# Patient Record
Sex: Female | Born: 2005 | Race: Black or African American | Hispanic: No | Marital: Single | Smoking: Never smoker
Health system: Southern US, Community
[De-identification: ages and names within clinical notes are randomized; demographics above are authoritative.]

## PROBLEM LIST (undated history)

## (undated) DIAGNOSIS — T7840XA Allergy, unspecified, initial encounter: Secondary | ICD-10-CM

## (undated) DIAGNOSIS — E109 Type 1 diabetes mellitus without complications: Secondary | ICD-10-CM

## (undated) DIAGNOSIS — L309 Dermatitis, unspecified: Secondary | ICD-10-CM

## (undated) HISTORY — DX: Allergy, unspecified, initial encounter: T78.40XA

## (undated) HISTORY — DX: Type 1 diabetes mellitus without complications: E10.9

---
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
## 2023-01-01 ENCOUNTER — Encounter (INDEPENDENT_AMBULATORY_CARE_PROVIDER_SITE_OTHER): Payer: Self-pay | Admitting: Pediatrics

## 2023-01-03 ENCOUNTER — Encounter (INDEPENDENT_AMBULATORY_CARE_PROVIDER_SITE_OTHER): Payer: Self-pay

## 2023-01-17 ENCOUNTER — Telehealth: Payer: Self-pay | Admitting: Pediatrics

## 2023-01-17 DIAGNOSIS — E1065 Type 1 diabetes mellitus with hyperglycemia: Secondary | ICD-10-CM

## 2023-01-17 MED ORDER — INSULIN LISPRO 100 UNIT/ML IJ SOLN
INTRAMUSCULAR | 1 refills | Status: DC
Start: 2023-01-17 — End: 2023-02-14

## 2023-01-17 NOTE — Telephone Encounter (Signed)
Received message from front office to call patient back.  Patient was thankful and insulin is ready for pick up.  We did discuss that EPIC flagged her insulin is cheaper by using mail order.  She would like to keep it at the local pharmacy for now.  She mentioned that she is switching to a Mobi and we should receive paperwork from [employer] soon.  I told her I will reach out to my rep Thayer Ohm to follow up.  She verbalized understanding.

## 2023-01-17 NOTE — Telephone Encounter (Signed)
  Name of who is calling: Danne Baxter   Caller's Relationship to Patient: Self  Best contact number: [phone]  Provider they see: Larinda Buttery  Reason for call: Patient needs insulin script transferred from [facility] to  [facility] [address] [place]     PRESCRIPTION REFILL ONLY  Name of prescription: Humalog  Pharmacy: From [facility] To [facility] [address] [place]

## 2023-01-23 ENCOUNTER — Encounter (INDEPENDENT_AMBULATORY_CARE_PROVIDER_SITE_OTHER): Payer: Self-pay | Admitting: Pediatrics

## 2023-01-23 DIAGNOSIS — E1065 Type 1 diabetes mellitus with hyperglycemia: Secondary | ICD-10-CM

## 2023-02-04 ENCOUNTER — Encounter (INDEPENDENT_AMBULATORY_CARE_PROVIDER_SITE_OTHER): Payer: Self-pay

## 2023-02-04 DIAGNOSIS — E109 Type 1 diabetes mellitus without complications: Secondary | ICD-10-CM

## 2023-02-04 MED ORDER — DEXCOM G6 TRANSMITTER MISC
1 refills | Status: DC
Start: 2023-02-04 — End: 2023-07-02

## 2023-02-07 ENCOUNTER — Telehealth (INDEPENDENT_AMBULATORY_CARE_PROVIDER_SITE_OTHER): Payer: Self-pay | Admitting: Pediatrics

## 2023-02-07 NOTE — Telephone Encounter (Signed)
  Name of who is calling: Nataly  Caller's Relationship to Patient: Self  Best contact number: [phone]    Provider they see: Larinda Buttery  Reason for call: Patient is attempting to get training on the tandum mobi pump and inquiring if she will be able to get training by or on her next appt with Jessup on 9/19.

## 2023-02-11 ENCOUNTER — Telehealth (INDEPENDENT_AMBULATORY_CARE_PROVIDER_SITE_OTHER): Payer: Self-pay | Admitting: Pediatrics

## 2023-02-11 DIAGNOSIS — E109 Type 1 diabetes mellitus without complications: Secondary | ICD-10-CM

## 2023-02-11 NOTE — Telephone Encounter (Signed)
  Name of who is calling: Lailanie  Caller's Relationship to Patient: Self  Best contact number: [phone]  Provider they see:  Dr.Jessup  Reason for call: Rasheka received a message from [place] regarding her new insilin pump. She's needing a PA.      PRESCRIPTION REFILL ONLY  Name of prescription: pandem mobi  Pharmacy: [facility]

## 2023-02-14 ENCOUNTER — Encounter (INDEPENDENT_AMBULATORY_CARE_PROVIDER_SITE_OTHER): Payer: Self-pay | Admitting: Pediatrics

## 2023-02-14 ENCOUNTER — Other Ambulatory Visit (INDEPENDENT_AMBULATORY_CARE_PROVIDER_SITE_OTHER): Payer: Self-pay | Admitting: Family

## 2023-02-14 DIAGNOSIS — E1065 Type 1 diabetes mellitus with hyperglycemia: Secondary | ICD-10-CM

## 2023-02-15 DIAGNOSIS — Z794 Long term (current) use of insulin: Secondary | ICD-10-CM | POA: Diagnosis not present

## 2023-02-15 DIAGNOSIS — E1065 Type 1 diabetes mellitus with hyperglycemia: Secondary | ICD-10-CM | POA: Diagnosis not present

## 2023-02-19 MED ORDER — OMNIPOD 5 G6 PODS (GEN 5) MISC
5 refills | Status: AC
Start: 2023-02-19 — End: ?

## 2023-02-19 NOTE — Addendum Note (Signed)
Addended by: Angelene Giovanni A on: 02/19/2023 08:39 AM   Modules accepted: Orders

## 2023-02-27 ENCOUNTER — Encounter (INDEPENDENT_AMBULATORY_CARE_PROVIDER_SITE_OTHER): Payer: Self-pay | Admitting: Pediatrics

## 2023-02-28 ENCOUNTER — Encounter: Payer: Self-pay | Admitting: *Deleted

## 2023-03-07 ENCOUNTER — Ambulatory Visit (INDEPENDENT_AMBULATORY_CARE_PROVIDER_SITE_OTHER): Payer: BC Managed Care – PPO | Admitting: Pediatrics

## 2023-03-07 ENCOUNTER — Encounter (INDEPENDENT_AMBULATORY_CARE_PROVIDER_SITE_OTHER): Payer: Self-pay | Admitting: Pediatrics

## 2023-03-07 VITALS — BP 116/80 | HR 80 | Ht 63.31 in | Wt 218.4 lb

## 2023-03-07 DIAGNOSIS — L83 Acanthosis nigricans: Secondary | ICD-10-CM | POA: Diagnosis not present

## 2023-03-07 DIAGNOSIS — E88819 Insulin resistance, unspecified: Secondary | ICD-10-CM

## 2023-03-07 DIAGNOSIS — Z9641 Presence of insulin pump (external) (internal): Secondary | ICD-10-CM | POA: Diagnosis not present

## 2023-03-07 DIAGNOSIS — E1065 Type 1 diabetes mellitus with hyperglycemia: Secondary | ICD-10-CM

## 2023-03-07 DIAGNOSIS — Z4681 Encounter for fitting and adjustment of insulin pump: Secondary | ICD-10-CM

## 2023-03-07 LAB — POCT GLUCOSE (DEVICE FOR HOME USE): Glucose Fasting, POC: 138 mg/dL — AB (ref 70–99)

## 2023-03-07 NOTE — Patient Instructions (Signed)
It was a pleasure to see you in clinic today.   Feel free to contact our office during normal business hours at [phone] with questions or concerns. If you have an emergency after normal business hours, please call the above number to reach our answering service who will contact the on-call pediatric endocrinologist.  If you choose to communicate with [place] via MyChart, please do not send urgent messages as this inbox is NOT monitored on nights or weekends.  Urgent concerns should be discussed with the on-call pediatric endocrinologist.  -Always have fast sugar with you in case of low blood sugar (glucose tabs, regular juice or soda, candy) -Always wear your ID that states you have diabetes -Always bring your meter/continuous glucose monitor to your visit  Hypoglycemia  Shaking or trembling. Sweating and chills. Dizziness or lightheadedness. Faster heart rate. Headaches. Hunger. Nausea. Nervousness or irritability. Pale skin. Restless sleep. Weakness. Blurry vision. Confusion or trouble concentrating. Sleepiness. Slurred speech. Tingling or numbness in the face or mouth.  How do I treat an episode of hypoglycemia? The American Diabetes Association recommends the "15-15 rule" for an episode of hypoglycemia: Eat or drink 15 grams of fast-acting carbs (4oz regular soda or juice, 1 pkg fruit snacks, 4 glucose tabs) to raise your blood sugar. After 15 minutes, check your blood sugar. If it's still below 80 mg/dL, have another 15 grams of fast-acting carbs. Repeat until your blood sugar is at least 80 mg/dL.  Hyperglycemia  Frequent urination Increased thirst Blurred vision Fatigue Headache          Diabetic Ketoacidosis (DKA)  If hyperglycemia goes untreated, it can cause toxic acids (ketones) to build up in your blood and urine (ketoacidosis). Signs and symptoms include: Fruity-smelling breath Nausea and vomiting Shortness of breath Dry  mouth Weakness Confusion Coma Abdominal pain  Sick day/Ketones Protocol  Check blood glucose every 3 hours  Give rapid acting insulin correction dose           every 3 hours until ketones are negative Check urine ketones every 2 hours (until ketones are negative)  Drink plenty of fluids (water, Pedialyte) every hour Notify clinic of sickness/ketones  If you develop signs of DKA (especially vomiting or abdominal pain and inability to drink), go to [facility] Pediatric Emergency room immediately.   Hemoglobin A1c levels

## 2023-03-07 NOTE — Progress Notes (Signed)
Pediatric Endocrinology Diabetes Consultation Follow-up Visit  Kimberly [name] 2005-10-12 [id number]  Chief Complaint: Follow-up type 1 diabetes with insulin resistance  Lucio Edward, MD   HPI: Kimberly [name] is a 17 y.o. female presenting for follow-up of the above concerns.  she attended this visit alone.  1. Lajoyce was hospitalized at [facility] on 03/30/16 after presenting to PCP's office with a 1.5 month hx of 35lb weight loss, fatigue, and 1 day of abdominal pain (no significant polyuria or nocturia).  CBG at PCP's office was 593 with 3+ urine ketones, so she was sent the [facility] ED where pH was 7.08, bicarb was 7, glucose was 620, anion gap was 18, with urine glucose >1000 and ketones >80.  She was admitted to PICU for insulin initiation.  Diabetes screening labs showed positive GAD Ab, positive islet cell Ab, positive insulin Ab, low c-peptide at 0.9, negative celiac screen, A1c >15.5, and normal TFTs (TSH 3.795, FT4 0.79). She was started on metformin for insulin resistance in 03/2017 and started an omnipod insulin pump in 05/2017.  She transitioned to omnipod 5 pump in 01/2021.  2. Since last visit on 06/27/22, she has been OK.   ED visits/Hospitalizations: No  Concerns:  -Working to get her license though A1c has been too high on omnipod 5.  Has changed to Mobi (was trained by [employer]; settings were transferred from omnipod.  Likes Mobi but going through cartridges quickly.   Omnipod 5 settings  Basal (Max: 3.5 units/hr) 12AM 1.4  8AM 1.9  12PM 2       Total: 42.8 units   Insulin to carbohydrate ratio (ICR) 12AM 8  6AM 5  12PM 5  5PM 5            Max Bolus: 30 units   Insulin Sensitivity Factor (ISF) 12AM 35  6AM 20  9PM 35                   Target BG 12AM 110                           Active Insulin Time: 3 hours     BG/Pump/CGM download:   Mobi started 02/27/23:  T-slim pump settings: Time Basal Rate Correction Factor Carb Ratio  Target BG  12AM 1.4 35 8 110  6AM 1.4 35 5 110  8AM 1.9 35 5 110  9AM 1.9 35 5 110  12PM 2 35 5 110  6PM 2 20 5  110        Total Daily Basal: 42.8 units/day     Interpretation: Overall blood sugars are much improved on Mobi  Hypoglycemia: Can feel lows. No glucagon needed Wearing Med-alert ID currently: needs to order one Injection sites: leg and abd Annual labs due: 08/2022- ordered today will draw next month Ophthalmology: 04/2020, no retinopathy reported. Reminded to schedule annual appt  Metformin XR 1500mg  daily -tries to take it but very hard to swallow.  Wants to know if there are alternatives besides Ozempic and metformin Cannot tolerate ozempic   ROS: All systems reviewed with pertinent positives listed below; otherwise negative. Constitutional: Weight has Increased 37 lb since last visit.         Past Medical History:   Past Medical History:  Diagnosis Date   Allergy    Eczema    Type 1 diabetes mellitus (HCC) 2017   +GAD ab, +Islet cell ab, +Insulin Ab, C-peptide 0.9  Medications:  Outpatient Encounter Medications as of 03/07/2023  Medication Sig   acetone, urine, test strip Check ketones per protocol   Alcohol Swabs (ALCOHOL PADS) 70 % PADS Use to wipe skin before injections   Blood Glucose Monitoring Suppl (CONTOUR NEXT EZ) w/Device KIT 1 kit by Does not apply route daily.   Continuous Blood Gluc Sensor (DEXCOM G6 SENSOR) MISC USE AS DIRECTED TO CHECK GLUCOSE. WEAR CONTINUSOULY FOR 10 DAYS THEN REPLACE WITH A NEW SENSOR.   Continuous Glucose Transmitter (DEXCOM G6 TRANSMITTER) MISC USE WITH G6 SENSORS - CHANGE TRANSMITTER EVERY 90 DAYS   Crisaborole (EUCRISA) 2 % OINT Apply 1 application topically 2 (two) times daily as needed.   Glucagon (BAQSIMI TWO PACK) 3 MG/DOSE POWD Place 1 application  into the nose as needed. Use as directed if unconscious, unable to take food po, or having a seizure due to hypoglycemia   glucose blood (CONTOUR NEXT TEST) test strip  Check glucose 6x daily   insulin lispro (HUMALOG KWIKPEN) 100 UNIT/ML KwikPen Up to 50 units/day   insulin lispro (HUMALOG) 100 UNIT/ML injection INJECT UP TO 200 UNITS EVERY 48 HOURS   Insulin Pen Needle (INSUPEN PEN NEEDLES) 32G X 4 MM MISC USE WITH INSULIN PEN 6 TIMES DAILY   metFORMIN (GLUCOPHAGE) 500 MG tablet Take 2 tablets (1,000 mg total) by mouth 2 (two) times daily with a meal.   metFORMIN (GLUCOPHAGE-XR) 750 MG 24 hr tablet Take 2 tablets (1,500 mg total) by mouth daily with supper.   norethindrone-ethinyl estradiol-FE (LOESTRIN FE) 1-20 MG-MCG tablet Take 1 tablet by mouth daily.   ACCU-CHEK FASTCLIX LANCETS MISC Check sugar 10 x daily (Patient not taking: Reported on 03/07/2023)   azelastine (ASTELIN) 0.1 % nasal spray 2 sprays each nostril 1-2 times daily as needed. (Patient not taking: Reported on 03/07/2023)   BD PEN NEEDLE NANO U/F 32G X 4 MM MISC USE 1 PEN NEEDLE AS DIRECTED (Patient not taking: Reported on 03/07/2023)   cetirizine (ZYRTEC) 10 MG tablet Take 1 tablet (10 mg total) by mouth daily. (Patient not taking: Reported on 03/07/2023)   fluticasone (FLONASE) 50 MCG/ACT nasal spray Use per MD order for site irritation. (Patient not taking: Reported on 03/07/2023)   Insulin Disposable Pump (OMNIPOD 5 G6 PODS, GEN 5,) MISC INJECT 1 POD SUBCUTANEOUSLY AS  DIRECTED AND CHANGE POD EVERY 48 HOURS (Patient not taking: Reported on 03/07/2023)   Insulin Glargine (LANTUS SOLOSTAR) 100 UNIT/ML Solostar Pen Up to 50 units per day as directed by MD (Patient not taking: Reported on 03/07/2023)   montelukast (SINGULAIR) 10 MG tablet Take 1 tablet (10 mg total) by mouth at bedtime. (Patient not taking: Reported on 03/07/2023)   mupirocin ointment (BACTROBAN) 2 % Apply to skin on ears three times a day for 5 days (Patient not taking: Reported on 03/07/2023)   Semaglutide,0.25 or 0.5MG /DOS, (OZEMPIC, 0.25 OR 0.5 MG/DOSE,) 2 MG/1.5ML SOPN Inject 0.5 mg into the skin once a week. Use this dose until your  next appointment with Dr. Larinda Buttery. (Patient not taking: Reported on 03/07/2023)   triamcinolone ointment (KENALOG) 0.1 % Apply 1 application topically 2 (two) times daily. (Patient not taking: Reported on 03/07/2023)   No facility-administered encounter medications on file as of 03/07/2023.   Allergies: No Known Allergies   Surgical History: History reviewed. No pertinent surgical history.  Family History:  Family History  Problem Relation Age of Onset   Insulin resistance Mother        Treated with metformin  Hypothyroidism Mother        Treated with synthroid   Allergic rhinitis Mother    Diabetes type II Maternal Grandmother    Diabetes type II Paternal Grandfather    Diabetes Maternal Uncle 15       treated with insulin   Diabetes type II Maternal Aunt    Hypothyroidism Maternal Aunt        treated with synthroid   Diabetes type II Sister    Allergic rhinitis Sister     Social History: Social History   Social History Narrative   Taking a break from college  Living in [place], they will spend majority of October at a camp in [place]  Physical Exam:  Vitals:   03/07/23 0811  BP: 116/80  Pulse: 80  Weight: 218 lb 6.4 oz (99.1 kg)  Height: 5' 3.31" (1.608 m)    BP 116/80 (BP Location: Left Arm, Patient Position: Sitting, Cuff Size: Normal)   Pulse 80   Ht 5' 3.31" (1.608 m)   Wt 218 lb 6.4 oz (99.1 kg)   BMI 38.31 kg/m  Body mass index: body mass index is 38.31 kg/m. Blood pressure %iles are not available for patients who are 18 years or older.  Wt Readings from Last 3 Encounters:  03/07/23 218 lb 6.4 oz (99.1 kg) (99%, Z= 2.19)*  06/27/22 181 lb 6.4 oz (82.3 kg) (95%, Z= 1.68)*  08/23/21 (!) 225 lb 2 oz (102.1 kg) (99%, Z= 2.26)*   * Growth percentiles are based on CDC (Girls, 2-20 Years) data.   Ht Readings from Last 3 Encounters:  03/07/23 5' 3.31" (1.608 m) (35%, Z= -0.38)*  08/23/21 5' 3.39" (1.61 m) (37%, Z= -0.32)*  05/17/21 5' 2.84" (1.596 m)  (30%, Z= -0.53)*   * Growth percentiles are based on CDC (Girls, 2-20 Years) data.   Body mass index is 38.31 kg/m. 99 %ile (Z= 2.19) based on CDC (Girls, 2-20 Years) weight-for-age data using data from 03/07/2023. 35 %ile (Z= -0.38) based on CDC (Girls, 2-20 Years) Stature-for-age data based on Stature recorded on 03/07/2023.  General: Well developed, well nourished female in no acute distress.  Appears stated age Head: Normocephalic, atraumatic.   Eyes:  Pupils equal and round. EOMI.   Sclera white.  No eye drainage.   Ears/Nose/Mouth/Throat: Nares patent, no nasal drainage.  Moist mucous membranes, normal dentition Neck: supple, no cervical lymphadenopathy, no thyromegaly Cardiovascular: regular rate, normal S1/S2, no murmurs Respiratory: No increased work of breathing.  Lungs clear to auscultation bilaterally.  No wheezes. Abdomen: soft, nontender, nondistended.  Extremities: warm, well perfused, cap refill < 2 sec.   Musculoskeletal: Normal muscle mass.  Normal strength Skin: warm, dry.  No rash or lesions.  Skin normal at pump sites Neurologic: alert and oriented, normal speech, no tremor   Labs: Results for orders placed or performed in visit on 03/07/23  POCT Glucose (Device for Home Use)  Result Value Ref Range   Glucose Fasting, POC 138 (A) 70 - 99 mg/dL   POC Glucose    POCT glycosylated hemoglobin (Hb A1C)  Result Value Ref Range   Hemoglobin A1C     HbA1c POC (<> result, manual entry)     HbA1c, POC (prediabetic range)     HbA1c, POC (controlled diabetic range) 11.8 (A) 0.0 - 7.0 %    A1c trend: >15.5 at diagnosis in 03/2016-->12.1% 08/2016-->13.6% 11/2016-->12.1% in 03/2017-->10.7% in 08/2017--> 10.5% 12/2017-->9.4% 03/2018 -->11% 07/2018--> 10.8% 01/2019--> 12% 05/2019--> 12.7% 10/2019--> 12.4% 01/2020-->12.2% 07/2020-->  12% 01/2021--> 8.7% 04/2021--> 10.7% 08/2021--> 14% 06/2022--> 11.8% 02/2023  Assessment/Plan:  Sarahelizabeth Cange is a 17 y.o. female with T1DM on a pump  ([employer] Mobi) and CGM regimen.   A1c is lower than last visit  The ADA goal for A1c is <7.0%.   Dexcom tracing shows she is not meeting goal of TIR >70%.  she is doing much better on the Mobi, though would benefit from a more concentrated insulin so she does not have to change the cartridge every other day.  A1c is improving, though not yet below the threshold for me to sign DMV paperwork.  She also has significant insulin resistance and is treated with metformin XR; she is unable to swallow metformin pills and did not tolerate Ozempic.  When a patient is on insulin, intensive monitoring of blood glucose levels and continuous insulin titration is vital to avoid insulin toxicity leading to severe hypoglycemia. Severe hypoglycemia can lead to seizure or death. Hyperglycemia can also result from inadequate insulin dosing and can lead to ketosis requiring ICU admission and intravenous insulin.   1. Type 1 diabetes with hyperglycemia -POC glucose and A1c as above, -CGM download reviewed extensively (see interpretation above), --Encouraged to wear med alert ID every day, -Encouraged to rotate injection sites, and -Provided with my contact information and advised to email/send mychart with questions/need for BG review -Given desire to obtain driver's license and recent improvement in A1c with mobi pump, will repeat hemoglobin A1c with annual screening labs in 1 month.  These have been ordered.  I will be in contact with her when I see these results. -Explained transition to U-200.  I will send a prescription for this to the pharmacy.  I have asked her to let me know when she is able to pick this up so I can send different pump settings.  Reviewed that if she does go back to OmniPod 5 using U-200, she will need to reset the PDM.  2. Insulin Pump in place -Made the following pump changes: No pump changes today.  Will switch to U200 as above Sleep mode turned off.   Pump Settings: T-slim pump  settings: Time Basal Rate Correction Factor Carb Ratio Target BG  12AM 1.4 35 8 110  6AM 1.4 35 5 110  8AM 1.9 35 5 110  9AM 1.9 35 5 110  12PM 2 35 5 110  6PM 2 20 5  110        Total Daily Basal: 42.8 units/day   If your pump breaks: Back-up lantus dose 42 units every 24 hours Novolog 1 unit for every 5 carbs (carb ratio)  Novolog correction 1 unit for every 20 above 150mg /dl (200mg /dl at bedtime)  Please call [phone] with questions      Pump settings using U-200: T-slim pump settings: Using U-200 Time Basal Rate Correction Factor Carb Ratio Target BG  12AM 0.7 70 16 110  6AM 0.7 70 10 110  8AM 0.9 70 10 110  9AM 0.9 70 10 110  12PM 1 70 10 110  6PM 1 40 10 110         3/4. Insulin resistance/acanthosis nigricans -Will stop metformin for now.  Since blood sugar also much improved on Mobi, we will not treat insulin resistance at this point.  Will consider other options for insulin resistance if needed at future visits.   Follow-up:   Return in about 3 months (around 06/06/2023).   >40 minutes spent today reviewing the medical chart, counseling  the patient/family, and documenting today's encounter.   Casimiro Needle, MD

## 2023-03-08 LAB — POCT GLYCOSYLATED HEMOGLOBIN (HGB A1C): HbA1c, POC (controlled diabetic range): 11.8 % — AB (ref 0.0–7.0)

## 2023-03-08 MED ORDER — HUMALOG KWIKPEN 200 UNIT/ML ~~LOC~~ SOPN
PEN_INJECTOR | SUBCUTANEOUS | 8 refills | Status: DC
Start: 2023-03-08 — End: 2023-05-15

## 2023-03-15 ENCOUNTER — Encounter (INDEPENDENT_AMBULATORY_CARE_PROVIDER_SITE_OTHER): Payer: Self-pay | Admitting: Pediatrics

## 2023-03-15 ENCOUNTER — Encounter: Payer: Self-pay | Admitting: Pediatrics

## 2023-04-11 ENCOUNTER — Encounter (INDEPENDENT_AMBULATORY_CARE_PROVIDER_SITE_OTHER): Payer: Self-pay | Admitting: Pediatrics

## 2023-04-11 ENCOUNTER — Encounter (INDEPENDENT_AMBULATORY_CARE_PROVIDER_SITE_OTHER): Payer: Self-pay | Admitting: Family

## 2023-05-03 ENCOUNTER — Other Ambulatory Visit (INDEPENDENT_AMBULATORY_CARE_PROVIDER_SITE_OTHER): Payer: Self-pay | Admitting: Pediatrics

## 2023-05-03 DIAGNOSIS — E1065 Type 1 diabetes mellitus with hyperglycemia: Secondary | ICD-10-CM

## 2023-05-07 ENCOUNTER — Encounter (INDEPENDENT_AMBULATORY_CARE_PROVIDER_SITE_OTHER): Payer: Self-pay | Admitting: Pediatrics

## 2023-05-07 ENCOUNTER — Telehealth (INDEPENDENT_AMBULATORY_CARE_PROVIDER_SITE_OTHER): Payer: Self-pay | Admitting: Pediatrics

## 2023-05-07 DIAGNOSIS — Z3202 Encounter for pregnancy test, result negative: Secondary | ICD-10-CM | POA: Diagnosis not present

## 2023-05-07 DIAGNOSIS — Z3041 Encounter for surveillance of contraceptive pills: Secondary | ICD-10-CM | POA: Diagnosis not present

## 2023-05-07 NOTE — Telephone Encounter (Signed)
  Name of who is calling: Kimberly [name]   Caller's Relationship to Patient:  Best contact number: [phone]  Provider they see: Larinda Buttery   Reason for call: Called regarding previous note, says she is headed to pharmacy but she needs a call back regarding her pump settings since its a higher concentrated solution.      PRESCRIPTION REFILL ONLY  Name of prescription:  Pharmacy:

## 2023-05-07 NOTE — Telephone Encounter (Signed)
Angelene Giovanni, RN sent pt the U-200 pump settings via [place] today.  Casimiro Needle, MD

## 2023-05-07 NOTE — Telephone Encounter (Signed)
Who's calling (name and relationship to patient) : Kimberly [name]; mom   Best contact number: [phone]  Provider they see: Dr. Larinda Buttery   Reason for call: [name] is calling stating that she sent a message through Mychart regarding the Humalog 200. She stated that she contacted the pharmacy, and she  would be heading up there in about an hour. She stated that she would be needing the settings due to it being a higher concentrated solution.    Call ID:      PRESCRIPTION REFILL ONLY  Name of prescription:  Pharmacy:

## 2023-05-07 NOTE — Telephone Encounter (Signed)
Returned call to patient, settings for U200 are in last progress note.  Screen shot and sent to patient by mychart.  Discussed she can see her last office visits in [place] in progress notes and her last one was 03/07/2023.  Told her to mychart me back if she has questions.  If she has questions after hours that Dr. Larinda Buttery is the one on call.

## 2023-05-10 ENCOUNTER — Encounter (INDEPENDENT_AMBULATORY_CARE_PROVIDER_SITE_OTHER): Payer: Self-pay | Admitting: Pediatrics

## 2023-05-10 DIAGNOSIS — E1065 Type 1 diabetes mellitus with hyperglycemia: Secondary | ICD-10-CM

## 2023-05-10 MED ORDER — DEXCOM G6 SENSOR MISC: 1 refills | Status: DC

## 2023-05-15 ENCOUNTER — Encounter (INDEPENDENT_AMBULATORY_CARE_PROVIDER_SITE_OTHER): Payer: Self-pay | Admitting: Pediatrics

## 2023-05-15 ENCOUNTER — Telehealth (INDEPENDENT_AMBULATORY_CARE_PROVIDER_SITE_OTHER): Payer: Self-pay | Admitting: Pediatrics

## 2023-05-15 DIAGNOSIS — E1065 Type 1 diabetes mellitus with hyperglycemia: Secondary | ICD-10-CM

## 2023-05-15 DIAGNOSIS — E88819 Insulin resistance, unspecified: Secondary | ICD-10-CM

## 2023-05-15 MED ORDER — HUMALOG KWIKPEN 200 UNIT/ML ~~LOC~~ SOPN: PEN_INJECTOR | SUBCUTANEOUS | 5 refills | Status: DC

## 2023-05-15 NOTE — Telephone Encounter (Signed)
A user error has taken place: encounter opened in error, closed for administrative reasons.

## 2023-05-15 NOTE — Telephone Encounter (Signed)
See my chart message

## 2023-05-15 NOTE — Telephone Encounter (Signed)
Spoke with on call provider, Dr. Quincy Sheehan to review the dosage of U200.  Sent in an update script to accommodate the amount of insulin being used per day in pump.

## 2023-05-15 NOTE — Telephone Encounter (Signed)
  Name of who is calling: Tesslyn   Caller's Relationship to Patient:   Best contact number: [phone]  Provider they see: Larinda Buttery   Reason for call: Called regarding insulin pens that she received, she says they are supposed to last a month but she has already used three out of the 6 that she has. She does not know if the pharmacy gave her the right amount or if she is doing something wrong on her end. She would like a call back regarding this as soon as possible. She also sent a MyChart message.      PRESCRIPTION REFILL ONLY  Name of prescription:  Pharmacy:

## 2023-05-17 DIAGNOSIS — Z794 Long term (current) use of insulin: Secondary | ICD-10-CM | POA: Diagnosis not present

## 2023-05-17 DIAGNOSIS — E1065 Type 1 diabetes mellitus with hyperglycemia: Secondary | ICD-10-CM | POA: Diagnosis not present

## 2023-05-22 ENCOUNTER — Ambulatory Visit (INDEPENDENT_AMBULATORY_CARE_PROVIDER_SITE_OTHER): Payer: BC Managed Care – PPO | Admitting: Pediatrics

## 2023-05-23 ENCOUNTER — Encounter (INDEPENDENT_AMBULATORY_CARE_PROVIDER_SITE_OTHER): Payer: Self-pay

## 2023-06-03 ENCOUNTER — Encounter (INDEPENDENT_AMBULATORY_CARE_PROVIDER_SITE_OTHER): Payer: Self-pay | Admitting: Pediatrics

## 2023-06-04 ENCOUNTER — Ambulatory Visit (INDEPENDENT_AMBULATORY_CARE_PROVIDER_SITE_OTHER): Payer: BC Managed Care – PPO | Admitting: Pediatrics

## 2023-06-21 DIAGNOSIS — E1065 Type 1 diabetes mellitus with hyperglycemia: Secondary | ICD-10-CM | POA: Diagnosis not present

## 2023-06-22 LAB — HEMOGLOBIN A1C
Est. average glucose Bld gHb Est-mCnc: 235 mg/dL
Hgb A1c MFr Bld: 9.8 % — ABNORMAL HIGH (ref 4.8–5.6)

## 2023-06-24 ENCOUNTER — Encounter (INDEPENDENT_AMBULATORY_CARE_PROVIDER_SITE_OTHER): Payer: Self-pay | Admitting: Pediatrics

## 2023-06-25 ENCOUNTER — Encounter (INDEPENDENT_AMBULATORY_CARE_PROVIDER_SITE_OTHER): Payer: Self-pay | Admitting: Pediatrics

## 2023-06-25 NOTE — Telephone Encounter (Signed)
 A1c <10%; will sign DMV paperwork.  Sent message to pt.  Casimiro Needle, MD

## 2023-06-28 ENCOUNTER — Encounter (INDEPENDENT_AMBULATORY_CARE_PROVIDER_SITE_OTHER): Payer: Self-pay

## 2023-07-02 ENCOUNTER — Other Ambulatory Visit (INDEPENDENT_AMBULATORY_CARE_PROVIDER_SITE_OTHER): Payer: Self-pay | Admitting: Pediatrics

## 2023-07-02 ENCOUNTER — Telehealth (INDEPENDENT_AMBULATORY_CARE_PROVIDER_SITE_OTHER): Payer: Self-pay | Admitting: Pediatrics

## 2023-07-02 DIAGNOSIS — E109 Type 1 diabetes mellitus without complications: Secondary | ICD-10-CM

## 2023-07-02 NOTE — Telephone Encounter (Signed)
 Who's calling (name and relationship to patient) : Mistina Coatney; self  Best contact number: [phone]  Provider they see: Dr. Willo  Reason for call: Azzie called in to follow up on [facility] paper work. She stated that she was suppose to get a call back this week since it wasn't going through and wanted know if it can be sent again. Requesting call back.    Call ID:      PRESCRIPTION REFILL ONLY  Name of prescription:  Pharmacy:

## 2023-07-03 ENCOUNTER — Encounter (INDEPENDENT_AMBULATORY_CARE_PROVIDER_SITE_OTHER): Payer: Self-pay

## 2023-07-09 ENCOUNTER — Encounter (INDEPENDENT_AMBULATORY_CARE_PROVIDER_SITE_OTHER): Payer: Self-pay | Admitting: Pediatrics

## 2023-07-09 DIAGNOSIS — E1065 Type 1 diabetes mellitus with hyperglycemia: Secondary | ICD-10-CM

## 2023-07-09 DIAGNOSIS — L83 Acanthosis nigricans: Secondary | ICD-10-CM

## 2023-07-09 DIAGNOSIS — E109 Type 1 diabetes mellitus without complications: Secondary | ICD-10-CM

## 2023-07-10 MED ORDER — BAQSIMI TWO PACK 3 MG/DOSE NA POWD: 1.0000 "application " | NASAL | 1 refills | Status: AC | PRN

## 2023-07-18 ENCOUNTER — Telehealth (INDEPENDENT_AMBULATORY_CARE_PROVIDER_SITE_OTHER): Payer: Self-pay | Admitting: Pediatrics

## 2023-07-18 DIAGNOSIS — E1065 Type 1 diabetes mellitus with hyperglycemia: Secondary | ICD-10-CM

## 2023-07-18 DIAGNOSIS — E88819 Insulin resistance, unspecified: Secondary | ICD-10-CM

## 2023-07-18 NOTE — Telephone Encounter (Signed)
Returned call to patient, pharmacy only filled for 18 ml instead 30 ml.  Discussed with patient, she has enough insulin and we will call the pharmacy and/or insurance tomorrow to work on it.  Thanked her for calling as soon as she realized the issue and not waiting until she was almost out of insulin.

## 2023-07-18 NOTE — Telephone Encounter (Signed)
Who's calling (name and relationship to patient) : Natsuko Kelsay; self  Best contact number: [phone]  Provider they see: Dr. Larinda Buttery   Reason for call: Kimberly [name] was calling to get Rx for insulin, she stated that the amount that was sent in doesn't hold her over. She stated the Rx that was sent insurance doesn't cover the amount that is needed. She stated that she is currently not out. She stated that she put 200 units in pump for 3 days. She is requesting a call back.   Call ID:      PRESCRIPTION REFILL ONLY  Name of prescription:  Pharmacy:

## 2023-07-19 MED ORDER — HUMALOG KWIKPEN 200 UNIT/ML ~~LOC~~ SOPN: PEN_INJECTOR | SUBCUTANEOUS | 5 refills | Status: DC

## 2023-07-19 MED ORDER — HUMALOG KWIKPEN 200 UNIT/ML ~~LOC~~ SOPN: PEN_INJECTOR | SUBCUTANEOUS | 5 refills | Status: AC

## 2023-07-19 NOTE — Telephone Encounter (Signed)
Called [facility] from the last fill in [place].  The pharmacy tech and I discussed the script, she looked into the system and discovered they had pulled 2 U200 scripts from previous pharmacies.  They filled the 18 ml one.  After discussion, she cancelled the orders in their system and I sent in a new order.  She updated the last fill days and hopefully will be able to fill the correct script in less than 15 days.  Told her I will update the patient and encourage her to request a refill before she is out in case we need to contact the insurance company to get her the correct fill.  She verbalized understanding.  Attempted to call patient, went straight to VM, sent mychart

## 2023-07-19 NOTE — Addendum Note (Signed)
Addended by: Angelene Giovanni A on: 07/19/2023 10:20 AM   Modules accepted: Orders

## 2023-07-19 NOTE — Addendum Note (Signed)
Addended by: Angelene Giovanni A on: 07/19/2023 01:53 PM   Modules accepted: Orders

## 2023-07-31 ENCOUNTER — Encounter (INDEPENDENT_AMBULATORY_CARE_PROVIDER_SITE_OTHER): Payer: Self-pay | Admitting: Pediatrics

## 2023-07-31 ENCOUNTER — Telehealth (INDEPENDENT_AMBULATORY_CARE_PROVIDER_SITE_OTHER): Payer: Self-pay | Admitting: Pediatrics

## 2023-07-31 DIAGNOSIS — E1065 Type 1 diabetes mellitus with hyperglycemia: Secondary | ICD-10-CM

## 2023-07-31 DIAGNOSIS — E109 Type 1 diabetes mellitus without complications: Secondary | ICD-10-CM

## 2023-07-31 MED ORDER — CONTOUR NEXT MONITOR W/DEVICE KIT: PACK | 1 refills | Status: AC

## 2023-07-31 MED ORDER — DEXCOM G6 TRANSMITTER MISC: 1 refills | Status: DC

## 2023-07-31 MED ORDER — MICROLET LANCETS MISC: 5 refills | Status: AC

## 2023-07-31 MED ORDER — DEXCOM G6 TRANSMITTER MISC
1 refills | Status: DC
Start: 2023-07-31 — End: 2023-07-31

## 2023-07-31 MED ORDER — CONTOUR NEXT TEST VI STRP: ORAL_STRIP | 5 refills | Status: AC

## 2023-07-31 MED ORDER — DEXCOM G6 SENSOR MISC: 1 refills | Status: DC

## 2023-07-31 MED ORDER — DEXCOM G6 SENSOR MISC
1 refills | Status: DC
Start: 2023-07-31 — End: 2023-07-31

## 2023-07-31 NOTE — Telephone Encounter (Signed)
Called regarding MyChart message that was sent, she states that sh eis in need of refills very urgently. States that she can get prescription through [facility], for free but they have to have it sent to them. Dexcom G6 and glucose kit. She would like a call back regarding this.  [phone].

## 2023-07-31 NOTE — Telephone Encounter (Signed)
See mychart message for update

## 2023-08-14 ENCOUNTER — Encounter (INDEPENDENT_AMBULATORY_CARE_PROVIDER_SITE_OTHER): Payer: Self-pay | Admitting: Pediatrics

## 2023-08-14 ENCOUNTER — Telehealth (INDEPENDENT_AMBULATORY_CARE_PROVIDER_SITE_OTHER): Payer: BC Managed Care – PPO | Admitting: Pediatrics

## 2023-08-14 VITALS — Ht 63.5 in | Wt 215.0 lb

## 2023-08-14 DIAGNOSIS — E1065 Type 1 diabetes mellitus with hyperglycemia: Secondary | ICD-10-CM

## 2023-08-14 DIAGNOSIS — L83 Acanthosis nigricans: Secondary | ICD-10-CM | POA: Diagnosis not present

## 2023-08-14 DIAGNOSIS — Z4681 Encounter for fitting and adjustment of insulin pump: Secondary | ICD-10-CM

## 2023-08-14 DIAGNOSIS — Z9641 Presence of insulin pump (external) (internal): Secondary | ICD-10-CM

## 2023-08-14 DIAGNOSIS — E88819 Insulin resistance, unspecified: Secondary | ICD-10-CM

## 2023-08-14 NOTE — Patient Instructions (Signed)
 It was a pleasure to see you in clinic today.   Feel free to contact our office during normal business hours at [phone] with questions or concerns. If you have an emergency after normal business hours, please call the above number to reach our answering service who will contact the on-call pediatric endocrinologist.  If you choose to communicate with [place] via MyChart, please do not send urgent messages as this inbox is NOT monitored on nights or weekends.  Urgent concerns should be discussed with the on-call pediatric endocrinologist.  -Always have fast sugar with you in case of low blood sugar (glucose tabs, regular juice or soda, candy) -Always wear your ID that states you have diabetes -Always bring your meter/continuous glucose monitor to your visit  Hypoglycemia  Shaking or trembling. Sweating and chills. Dizziness or lightheadedness. Faster heart rate. Headaches. Hunger. Nausea. Nervousness or irritability. Pale skin. Restless sleep. Weakness. Blurry vision. Confusion or trouble concentrating. Sleepiness. Slurred speech. Tingling or numbness in the face or mouth.  How do I treat an episode of hypoglycemia? The American Diabetes Association recommends the "15-15 rule" for an episode of hypoglycemia: Eat or drink 15 grams of fast-acting carbs (4oz regular soda or juice, 1 pkg fruit snacks, 4 glucose tabs) to raise your blood sugar. After 15 minutes, check your blood sugar. If it's still below 80 mg/dL, have another 15 grams of fast-acting carbs. Repeat until your blood sugar is at least 80 mg/dL.  Hyperglycemia  Frequent urination Increased thirst Blurred vision Fatigue Headache          Diabetic Ketoacidosis (DKA)  If hyperglycemia goes untreated, it can cause toxic acids (ketones) to build up in your blood and urine (ketoacidosis). Signs and symptoms include: Fruity-smelling breath Nausea and vomiting Shortness of breath Dry  mouth Weakness Confusion Coma Abdominal pain  Sick day/Ketones Protocol  Check blood glucose every 3 hours  Give rapid acting insulin correction dose           every 3 hours until ketones are negative Check urine ketones every 2 hours (until ketones are negative)  Drink plenty of fluids (water, Pedialyte) every hour Notify clinic of sickness/ketones  If you develop signs of DKA (especially vomiting or abdominal pain and inability to drink), go to [facility] Emergency room immediately.   Hemoglobin A1c levels

## 2023-08-14 NOTE — Progress Notes (Signed)
 Pediatric Endocrinology Diabetes Consultation Follow-up Visit  Kimberly [name] [birth year] [id number]  Chief Complaint: Follow-up type 1 diabetes with insulin resistance  Kimberly Edward, MD   HPI: Kimberly [name] is a 18 y.o. female presenting for follow-up of the above concerns.  she attended this visit alone. THIS IS A TELEHEALTH VIDEO VISIT.   1. Avrianna was hospitalized at [facility] on 03/30/16 after presenting to PCP's office with a 1.5 month hx of 35lb weight loss, fatigue, and 1 day of abdominal pain (no significant polyuria or nocturia).  CBG at PCP's office was 593 with 3+ urine ketones, so she was sent the [facility] ED where pH was 7.08, bicarb was 7, glucose was 620, anion gap was 18, with urine glucose >1000 and ketones >80.  She was admitted to PICU for insulin initiation.  Diabetes screening labs showed positive GAD Ab, positive islet cell Ab, positive insulin Ab, low c-peptide at 0.9, negative celiac screen, A1c >15.5, and normal TFTs (TSH 3.795, FT4 0.79). She was started on metformin for insulin resistance in 03/2017 and started an omnipod insulin pump in 05/2017.  She transitioned to omnipod 5 pump in 01/2021. She changed to tandem Mobi 02/2023.  2. Since last visit on 03/07/23, she has been OK.   ED visits/Hospitalizations: No  Concerns:  -Has had issues recently; relationship with mother has not been good so she has moved in with her boyfriend in [place], [place] -Needs to find new endocrine provider in [place], [place]  Mobi started 02/27/23: Pump not currently sending info to [employer] (pump needs to be re-paired)  T-slim pump settings: Time Basal Rate Correction Factor Carb Ratio Target BG  12AM 1.4 35 8 110  6AM 1.4 35 5 110  8AM 1.9 35 5 110  9AM 1.9 35 5 110  12PM 2 35 5 110  6PM 2 20 5  110        Total Daily Basal: 42.8 units/day    Pump settings using U-200: T-slim pump settings: Using U-200 Time Basal Rate Correction Factor Carb Ratio Target BG   12AM 0.7 70 16 110  6AM 0.7 70 10 110  8AM 0.9 70 10 110  9AM 0.9 70 10 110  12PM 1 70 10 110  6PM 1 40 10 110         Currently using U-100 as pharmacy had trouble getting supply of U-200   CGM:      Interpretation: Difficult to make changes currently due to issues with pump download.  GIR 8.7%  Hypoglycemia: Can feel lows. No glucagon needed Wearing Med-alert ID currently: has a bracelet on her keychain Injection sites: leg and abd Annual labs due: 08/2022-Will need drawn at her next endocrine visit Ophthalmology: 04/2020, no retinopathy reported. Reminded to schedule annual appt  Has insulin resistance; has tried metformin XR 1500mg  daily (found these tabs hard to swallow) and ozempic. Cannot tolerate ozempic   ROS: All systems reviewed with pertinent positives listed below; otherwise negative.        Past Medical History:   Past Medical History:  Diagnosis Date   Allergy    Eczema    Type 1 diabetes mellitus (HCC) 2017   +GAD ab, +Islet cell ab, +Insulin Ab, C-peptide 0.9    Medications:  Outpatient Encounter Medications as of 08/14/2023  Medication Sig   acetone, urine, test strip Check ketones per protocol   Alcohol Swabs (ALCOHOL PADS) 70 % PADS Use to wipe skin before injections   Blood Glucose  Monitoring Suppl (CONTOUR NEXT EZ) w/Device KIT 1 kit by Does not apply route daily.   Blood Glucose Monitoring Suppl (CONTOUR NEXT MONITOR) w/Device KIT Use as directed to check Blood glucose 6x/day   cetirizine (ZYRTEC) 10 MG tablet Take 1 tablet (10 mg total) by mouth daily.   Continuous Glucose Sensor (DEXCOM G6 SENSOR) MISC USE AS DIRECTED TO CHECK GLUCOSE. WEAR CONTINUSOULY FOR 10 DAYS THEN REPLACE WITH A NEW SENSOR.   Continuous Glucose Transmitter (DEXCOM G6 TRANSMITTER) MISC USE WITH G6 SENSORS, CHANGE  TRANSMITTER EVERY 90 DAYS   Crisaborole (EUCRISA) 2 % OINT Apply 1 application topically 2 (two) times daily as needed.   Glucagon (BAQSIMI TWO PACK) 3 MG/DOSE  POWD Place 1 application  into the nose as needed. Use as directed if unconscious, unable to take food po, or having a seizure due to hypoglycemia   glucose blood (CONTOUR NEXT TEST) test strip Check glucose 6x daily   glucose blood (CONTOUR NEXT TEST) test strip Use as instructed 6x/day   insulin lispro (HUMALOG KWIKPEN) 100 UNIT/ML KwikPen Up to 50 units/day   insulin lispro (HUMALOG KWIKPEN) 200 UNIT/ML KwikPen Inject up to 200 units per day into insulin pump as instructed   insulin lispro (HUMALOG) 100 UNIT/ML injection INJECT UP TO 200 UNITS INTO THE SKIN EVERY 48 HOURS   Insulin Pen Needle (INSUPEN PEN NEEDLES) 32G X 4 MM MISC USE WITH INSULIN PEN 6 TIMES DAILY   metFORMIN (GLUCOPHAGE) 500 MG tablet Take 2 tablets (1,000 mg total) by mouth 2 (two) times daily with a meal.   metFORMIN (GLUCOPHAGE-XR) 750 MG 24 hr tablet Take 2 tablets (1,500 mg total) by mouth daily with supper.   Microlet Lancets MISC Use as directed 6x/day   norethindrone-ethinyl estradiol-FE (LOESTRIN FE) 1-20 MG-MCG tablet Take 1 tablet by mouth daily.   ACCU-CHEK FASTCLIX LANCETS MISC Check sugar 10 x daily (Patient not taking: Reported on 08/14/2023)   azelastine (ASTELIN) 0.1 % nasal spray 2 sprays each nostril 1-2 times daily as needed. (Patient not taking: Reported on 08/14/2023)   BD PEN NEEDLE NANO U/F 32G X 4 MM MISC USE 1 PEN NEEDLE AS DIRECTED (Patient not taking: Reported on 08/14/2023)   fluticasone (FLONASE) 50 MCG/ACT nasal spray Use per MD order for site irritation. (Patient not taking: Reported on 08/14/2023)   Insulin Disposable Pump (OMNIPOD 5 G6 PODS, GEN 5,) MISC INJECT 1 POD SUBCUTANEOUSLY AS  DIRECTED AND CHANGE POD EVERY 48 HOURS (Patient not taking: Reported on 08/14/2023)   Insulin Glargine (LANTUS SOLOSTAR) 100 UNIT/ML Solostar Pen Up to 50 units per day as directed by MD (Patient not taking: Reported on 08/14/2023)   montelukast (SINGULAIR) 10 MG tablet Take 1 tablet (10 mg total) by mouth at bedtime.  (Patient not taking: Reported on 08/14/2023)   mupirocin ointment (BACTROBAN) 2 % Apply to skin on ears three times a day for 5 days (Patient not taking: Reported on 08/14/2023)   Semaglutide,0.25 or 0.5MG /DOS, (OZEMPIC, 0.25 OR 0.5 MG/DOSE,) 2 MG/1.5ML SOPN Inject 0.5 mg into the skin once a week. Use this dose until your next appointment with Dr. Larinda Buttery. (Patient not taking: Reported on 08/14/2023)   triamcinolone ointment (KENALOG) 0.1 % Apply 1 application topically 2 (two) times daily. (Patient not taking: Reported on 08/14/2023)   No facility-administered encounter medications on file as of 08/14/2023.   Allergies: No Known Allergies   Surgical History: History reviewed. No pertinent surgical history.  Family History:  Family History  Problem Relation Age  of Onset   Insulin resistance Mother        Treated with metformin   Hypothyroidism Mother        Treated with synthroid   Allergic rhinitis Mother    Diabetes type II Maternal Grandmother    Diabetes type II Paternal Grandfather    Diabetes Maternal Uncle 15       treated with insulin   Diabetes type II Maternal Aunt    Hypothyroidism Maternal Aunt        treated with synthroid   Diabetes type II Sister    Allergic rhinitis Sister     Social History: Social History   Social History Narrative   Taking a break from college    Physical Exam:  Vitals:   08/14/23 0839  Weight: 215 lb (97.5 kg)  Height: 5' 3.5" (1.613 m)     Ht 5' 3.5" (1.613 m)   Wt 215 lb (97.5 kg)   LMP  (LMP Unknown)   BMI 37.49 kg/m  Body mass index: body mass index is 37.49 kg/m. Blood pressure %iles are not available for patients who are 18 years or older.  Wt Readings from Last 3 Encounters:  08/14/23 215 lb (97.5 kg) (98%, Z= 2.15)*  03/07/23 218 lb 6.4 oz (99.1 kg) (99%, Z= 2.19)*  06/27/22 181 lb 6.4 oz (82.3 kg) (95%, Z= 1.68)*   * Growth percentiles are based on CDC (Girls, 2-20 Years) data.   Ht Readings from Last 3 Encounters:   08/14/23 5' 3.5" (1.613 m) (38%, Z= -0.31)*  03/07/23 5' 3.31" (1.608 m) (35%, Z= -0.38)*  08/23/21 5' 3.39" (1.61 m) (37%, Z= -0.32)*   * Growth percentiles are based on CDC (Girls, 2-20 Years) data.   Body mass index is 37.49 kg/m. 98 %ile (Z= 2.15) based on CDC (Girls, 2-20 Years) weight-for-age data using data from 08/14/2023. 38 %ile (Z= -0.31) based on CDC (Girls, 2-20 Years) Stature-for-age data based on Stature recorded on 08/14/2023.  General: Well developed, well nourished female in no acute distress.  Appears stated age Head: Normocephalic, atraumatic.   Eyes:  Pupils equal and round. Sclera white.  No eye drainage.   Ears/Nose/Mouth/Throat: Nares patent, no nasal drainage.  Neck: No obvious thyromegaly Cardiovascular: Well perfused, no cyanosis Respiratory: No increased work of breathing.  No cough. Neurologic: alert and oriented, normal speech   Labs: Results for orders placed or performed in visit on 03/07/23  POCT Glucose (Device for Home Use)   Collection Time: 03/07/23  8:31 AM  Result Value Ref Range   Glucose Fasting, POC 138 (A) 70 - 99 mg/dL   POC Glucose    POCT glycosylated hemoglobin (Hb A1C)   Collection Time: 03/08/23  8:19 AM  Result Value Ref Range   Hemoglobin A1C     HbA1c POC (<> result, manual entry)     HbA1c, POC (prediabetic range)     HbA1c, POC (controlled diabetic range) 11.8 (A) 0.0 - 7.0 %    A1c trend: >15.5 at diagnosis in 03/2016-->12.1% 08/2016-->13.6% 11/2016-->12.1% in 03/2017-->10.7% in 08/2017--> 10.5% 12/2017-->9.4% 03/2018 -->11% 07/2018--> 10.8% 01/2019--> 12% 05/2019--> 12.7% 10/2019--> 12.4% 01/2020-->12.2% 07/2020--> 12% 01/2021--> 8.7% 04/2021--> 10.7% 08/2021--> 14% 06/2022--> 11.8% 02/2023--> 9.8% 06/2023  Assessment/Plan:  Jordie Skalsky is a 18 y.o. female with T1DM on a pump (Tandem Mobi) and CGM regimen.   A1c is lower than last visit (A1c 9.8% 06/2023); GIR lower than most recent A1c at 8.7% The ADA goal for A1c is <7.0%.  Dexcom tracing shows she is not meeting goal of TIR >70%.  Unable to determine which changes are needed to insulin pump today due to lack of data  When a patient is on insulin, intensive monitoring of blood glucose levels and continuous insulin titration is vital to avoid insulin toxicity leading to severe hypoglycemia. Severe hypoglycemia can lead to seizure or death. Hyperglycemia can also result from inadequate insulin dosing and can lead to ketosis requiring ICU admission and intravenous insulin.   1. Type 1 diabetes with hyperglycemia -CGM download reviewed extensively (see interpretation above), --Encouraged to wear med alert ID every day, and -Provided with my contact information and advised to email/send mychart with questions/need for BG review Discussed switching between settings when she has U-100 vs U-200  2. Insulin Pump in place -No pump changes made. Continue current settings.  3/4. Insulin resistance/acanthosis nigricans -Adult endocrine may consider adding additional medication to help with this.   Follow-up:   Will transition to adult endocrine in [place], [place]   41 minutes spent today reviewing the medical chart, counseling the patient/family, and documenting today's encounter (this does not include time spent on my personal interpretation of CGM).   Casimiro Needle, MD

## 2023-08-14 NOTE — Progress Notes (Signed)
 Is the patient/family in a moving vehicle?NO If yes, please ask family to pull over and park in a safe place to continue the visit.  This is a Pediatric Specialist E-Visit consult/follow up provided via My Chart Video Visit (Caregility). Kimberly [name]  (name of consenting adult) consented to an E-Visit consult today.  Is the patient present for the video visit? Yes Location of patient: [name] is at virtual (home) Is the patient located in the state of [place]? Yes Location of provider: Johnnette Gourd is at virtual (Pediatric Specialist) Patient was referred by Lucio Edward, MD   The following participants were involved in this E-Visit: Melanie Crazier, CMA, patient (list of participants and their roles)  This visit was done via VIDEO   Chief Complain/ Reason for E-Visit today: management of T1DM Total time on call: 16 minutes Follow up: Will transition to adult endocrine in [place], [place]

## 2023-08-22 ENCOUNTER — Ambulatory Visit (INDEPENDENT_AMBULATORY_CARE_PROVIDER_SITE_OTHER): Payer: BC Managed Care – PPO | Admitting: Pediatrics

## 2023-09-24 ENCOUNTER — Encounter (INDEPENDENT_AMBULATORY_CARE_PROVIDER_SITE_OTHER): Payer: Self-pay | Admitting: Pediatrics

## 2023-09-24 ENCOUNTER — Encounter (INDEPENDENT_AMBULATORY_CARE_PROVIDER_SITE_OTHER): Payer: Self-pay

## 2023-10-07 ENCOUNTER — Encounter (INDEPENDENT_AMBULATORY_CARE_PROVIDER_SITE_OTHER): Payer: Self-pay

## 2023-12-30 ENCOUNTER — Telehealth: Payer: Self-pay

## 2023-12-30 ENCOUNTER — Telehealth: Payer: Self-pay | Admitting: Physician Assistant

## 2023-12-30 DIAGNOSIS — F418 Other specified anxiety disorders: Secondary | ICD-10-CM

## 2023-12-30 NOTE — Patient Instructions (Signed)
 Lindajo Angelyn Graft, thank you for joining Delon CHRISTELLA Dickinson, PA-C for today's virtual visit.  While this provider is not your primary care provider (PCP), if your PCP is located in our provider database this encounter information will be shared with them immediately following your visit.   A [facility] MyChart account gives you access to today's visit and all your visits, tests, and labs performed at [facility]  click here if you don't have a [facility] MyChart account or go to [email or web address]  Consent: (Patient) Kimberly [name] provided verbal consent for this virtual visit at the beginning of the encounter.  Current Medications:  Current Outpatient Medications:    ACCU-CHEK FASTCLIX LANCETS MISC, Check sugar 10 x daily (Patient not taking: Reported on 08/14/2023), Disp: 306 each, Rfl: 3   acetone, urine, test strip, Check ketones per protocol, Disp: 50 each, Rfl: 3   Alcohol  Swabs (ALCOHOL  PADS) 70 % PADS, Use to wipe skin before injections, Disp: 200 each, Rfl: 6   azelastine  (ASTELIN ) 0.1 % nasal spray, 2 sprays each nostril 1-2 times daily as needed. (Patient not taking: Reported on 08/14/2023), Disp: 30 mL, Rfl: 1   BD PEN NEEDLE NANO U/F 32G X 4 MM MISC, USE 1 PEN NEEDLE AS DIRECTED (Patient not taking: Reported on 08/14/2023), Disp: 200 each, Rfl: 3   Blood Glucose Monitoring Suppl (CONTOUR NEXT EZ) w/Device KIT, 1 kit by Does not apply route daily., Disp: 2 kit, Rfl: 6   Blood Glucose Monitoring Suppl (CONTOUR NEXT MONITOR) w/Device KIT, Use as directed to check Blood glucose 6x/day, Disp: 1 kit, Rfl: 1   cetirizine  (ZYRTEC ) 10 MG tablet, Take 1 tablet (10 mg total) by mouth daily., Disp: 30 tablet, Rfl: 2   Continuous Glucose Sensor (DEXCOM G6 SENSOR) MISC, USE AS DIRECTED TO CHECK GLUCOSE. WEAR CONTINUSOULY FOR 10 DAYS THEN REPLACE WITH A NEW SENSOR., Disp: 9 each, Rfl: 1   Continuous Glucose Transmitter (DEXCOM G6 TRANSMITTER) MISC, USE WITH G6  SENSORS, CHANGE  TRANSMITTER EVERY 90 DAYS, Disp: 1 each, Rfl: 1   Crisaborole  (EUCRISA ) 2 % OINT, Apply 1 application topically 2 (two) times daily as needed., Disp: 60 g, Rfl: 1   fluticasone  (FLONASE ) 50 MCG/ACT nasal spray, Use per MD order for site irritation. (Patient not taking: Reported on 08/14/2023), Disp: 16 g, Rfl: 5   Glucagon  (BAQSIMI  TWO PACK) 3 MG/DOSE POWD, Place 1 application  into the nose as needed. Use as directed if unconscious, unable to take food po, or having a seizure due to hypoglycemia, Disp: 2 each, Rfl: 1   glucose blood (CONTOUR NEXT TEST) test strip, Check glucose 6x daily, Disp: 200 each, Rfl: 0   glucose blood (CONTOUR NEXT TEST) test strip, Use as instructed 6x/day, Disp: 200 each, Rfl: 5   Insulin  Disposable Pump (OMNIPOD 5 G6 PODS, GEN 5,) MISC, INJECT 1 POD SUBCUTANEOUSLY AS  DIRECTED AND CHANGE POD EVERY 48 HOURS (Patient not taking: Reported on 08/14/2023), Disp: 15 each, Rfl: 5   Insulin  Glargine (LANTUS  SOLOSTAR) 100 UNIT/ML Solostar Pen, Up to 50 units per day as directed by MD (Patient not taking: Reported on 08/14/2023), Disp: 15 mL, Rfl: 6   insulin  lispro (HUMALOG  KWIKPEN) 100 UNIT/ML KwikPen, Up to 50 units/day, Disp: 15 mL, Rfl: 5   insulin  lispro (HUMALOG  KWIKPEN) 200 UNIT/ML KwikPen, Inject up to 200 units per day into insulin  pump as instructed, Disp: 30 mL, Rfl: 5   insulin  lispro (HUMALOG ) 100 UNIT/ML injection, INJECT UP TO 200  UNITS INTO THE SKIN EVERY 48 HOURS, Disp: 120 mL, Rfl: 0   Insulin  Pen Needle (INSUPEN PEN NEEDLES) 32G X 4 MM MISC, USE WITH INSULIN  PEN 6 TIMES DAILY, Disp: 200 each, Rfl: 5   metFORMIN  (GLUCOPHAGE ) 500 MG tablet, Take 2 tablets (1,000 mg total) by mouth 2 (two) times daily with a meal., Disp: 360 tablet, Rfl: 3   metFORMIN  (GLUCOPHAGE -XR) 750 MG 24 hr tablet, Take 2 tablets (1,500 mg total) by mouth daily with supper., Disp: 60 tablet, Rfl: 4   Microlet Lancets MISC, Use as directed 6x/day, Disp: 200 each, Rfl: 5    montelukast  (SINGULAIR ) 10 MG tablet, Take 1 tablet (10 mg total) by mouth at bedtime. (Patient not taking: Reported on 08/14/2023), Disp: 30 tablet, Rfl: 5   mupirocin  ointment (BACTROBAN ) 2 %, Apply to skin on ears three times a day for 5 days (Patient not taking: Reported on 08/14/2023), Disp: 22 g, Rfl: 0   norethindrone-ethinyl estradiol-FE (LOESTRIN FE) 1-20 MG-MCG tablet, Take 1 tablet by mouth daily., Disp: , Rfl:    Semaglutide ,0.25 or 0.5MG /DOS, (OZEMPIC , 0.25 OR 0.5 MG/DOSE,) 2 MG/1.5ML SOPN, Inject 0.5 mg into the skin once a week. Use this dose until your next appointment with Dr. Jessup. (Patient not taking: Reported on 08/14/2023), Disp: 1.5 mL, Rfl: 2   triamcinolone  ointment (KENALOG ) 0.1 %, Apply 1 application topically 2 (two) times daily. (Patient not taking: Reported on 08/14/2023), Disp: 30 g, Rfl: 3   Medications ordered in this encounter:  No orders of the defined types were placed in this encounter.    *If you need refills on other medications prior to your next appointment, please contact your pharmacy*  Follow-Up: Call back or seek an in-person evaluation if the symptoms worsen or if the condition fails to improve as anticipated.  [facility] [phone]  Other Instructions Managing Anxiety, Adult After being diagnosed with anxiety, you may be relieved to know why you have felt or behaved a certain way. You may also feel overwhelmed about the treatment ahead and what it will mean for your life. With care and support, you can manage your anxiety. How to manage lifestyle changes Understanding the difference between stress and anxiety Although stress can play a role in anxiety, it is not the same as anxiety. Stress is your body's reaction to life changes and events, both good and bad. Stress is often caused by something external, such as a deadline, test, or competition. It normally goes away after the event has ended and will last just a few hours. But,  stress can be ongoing and can lead to more than just stress. Anxiety is caused by something internal, such as imagining a terrible outcome or worrying that something will go wrong that will greatly upset you. Anxiety often does not go away even after the event is over, and it can become a long-term (chronic) worry. Lowering stress and anxiety Talk with your health care provider or a counselor to learn more about lowering anxiety and stress. They may suggest tension-reduction techniques, such as: Music. Spend time creating or listening to music that you enjoy and that inspires you. Mindfulness-based meditation. Practice being aware of your normal breaths while not trying to control your breathing. It can be done while sitting or walking. Centering prayer. Focus on a word, phrase, or sacred image that means something to you and brings you peace. Deep breathing. Expand your stomach and inhale slowly through your nose. Hold your breath for 3-5  seconds. Then breathe out slowly, letting your stomach muscles relax. Self-talk. Learn to notice and spot thought patterns that lead to anxiety reactions. Change those patterns to thoughts that feel peaceful. Muscle relaxation. Take time to tense muscles and then relax them. Choose a tension-reduction technique that fits your lifestyle and personality. These techniques take time and practice. Set aside 5-15 minutes a day to do them. Specialized therapists can offer counseling and training in these techniques. The training to help with anxiety may be covered by some insurance plans. Other things you can do to manage stress and anxiety include: Keeping a stress diary. This can help you learn what triggers your reaction and then learn ways to manage your response. Thinking about how you react to certain situations. You may not be able to control everything, but you can control your response. Making time for activities that help you relax and not feeling guilty about  spending your time in this way. Doing visual imagery. This involves imagining or creating mental pictures to help you relax. Practicing yoga. Through yoga poses, you can lower tension and relax.  Medicines Medicines for anxiety include: Antidepressant medicines. These are usually prescribed for long-term daily control. Anti-anxiety medicines. These may be added in severe cases, especially when panic attacks occur. When used together, medicines, psychotherapy, and tension-reduction techniques may be the most effective treatment. Relationships Relationships can play a big part in helping you recover. Spend more time connecting with trusted friends and family members. Think about going to couples counseling if you have a partner, taking family education classes, or going to family therapy. Therapy can help you and others better understand your anxiety. How to recognize changes in your anxiety Everyone responds differently to treatment for anxiety. Recovery from anxiety happens when symptoms lessen and stop interfering with your daily life at home or work. This may mean that you will start to: Have better concentration and focus. Worry will interfere less in your daily thinking. Sleep better. Be less irritable. Have more energy. Have improved memory. Try to recognize when your condition is getting worse. Contact your provider if your symptoms interfere with home or work and you feel like your condition is not improving. Follow these instructions at home: Activity Exercise. Adults should: Exercise for at least 150 minutes each week. The exercise should increase your heart rate and make you sweat (moderate-intensity exercise). Do strengthening exercises at least twice a week. Get the right amount and quality of sleep. Most adults need 7-9 hours of sleep each night. Lifestyle  Eat a healthy diet that includes plenty of vegetables, fruits, whole grains, low-fat dairy products, and lean  protein. Do not eat a lot of foods that are high in fats, added sugars, or salt (sodium). Make choices that simplify your life. Do not use any products that contain nicotine or tobacco. These products include cigarettes, chewing tobacco, and vaping devices, such as e-cigarettes. If you need help quitting, ask your provider. Avoid caffeine, alcohol , and certain over-the-counter cold medicines. These may make you feel worse. Ask your pharmacist which medicines to avoid. General instructions Take over-the-counter and prescription medicines only as told by your provider. Keep all follow-up visits. This is to make sure you are managing your anxiety well or if you need more support. Where to find support You can get help and support from: Self-help groups. Online and [employer]. A trusted spiritual leader. Couples counseling. Family education classes. Family therapy. Where to find more information You may find that joining a  support group helps you deal with your anxiety. The following sources can help you find counselors or support groups near you: Mental Health America: mentalhealthamerica.net Anxiety and Depression Association of [place] (ADAA): adaa.org The [employer] American on Mental Illness (NAMI): nami.org Contact a health care provider if: You have a hard time staying focused or finishing tasks. You spend many hours a day feeling worried about everyday life. You are very tired because you cannot stop worrying. You start to have headaches or often feel tense. You have chronic nausea or diarrhea. Get help right away if: Your heart feels like it is racing. You have shortness of breath. You have thoughts of hurting yourself or others. Get help right away if you feel like you may hurt yourself or others, or have thoughts about taking your own life. Go to your nearest emergency room or: Call 911. Call the National Suicide Prevention Lifeline at (779)227-7211 or 988. This is  open 24 hours a day. Text the Crisis Text Line at 414-333-0430. This information is not intended to replace advice given to you by your health care provider. Make sure you discuss any questions you have with your health care provider. Document Revised: 03/13/2022 Document Reviewed: 09/25/2020 Elsevier Patient Education  2024 Elsevier Inc.   If you have been instructed to have an in-person evaluation today at a local Urgent Care facility, please use the link below. It will take you to a list of all of our available [facility], including address, phone number and hours of operation. Please do not delay care.  [facility]  If you or a family member do not have a primary care provider, use the link below to schedule a visit and establish care. When you choose a [facility] primary care physician or advanced practice provider, you gain a long-term partner in health. Find a Primary Care Provider  Learn more about [facility]'s in-office and virtual care options: [facility] - Get Care Now

## 2023-12-30 NOTE — Progress Notes (Signed)
 Patient needing to get her animals (cats) certified as emotional support animals.  Will mark no charge as we cannot assist in this matter.

## 2024-01-15 ENCOUNTER — Other Ambulatory Visit (INDEPENDENT_AMBULATORY_CARE_PROVIDER_SITE_OTHER): Payer: Self-pay

## 2024-01-15 DIAGNOSIS — E1065 Type 1 diabetes mellitus with hyperglycemia: Secondary | ICD-10-CM

## 2024-01-29 ENCOUNTER — Other Ambulatory Visit (INDEPENDENT_AMBULATORY_CARE_PROVIDER_SITE_OTHER): Payer: Self-pay

## 2024-01-29 DIAGNOSIS — E1065 Type 1 diabetes mellitus with hyperglycemia: Secondary | ICD-10-CM

## 2024-02-05 ENCOUNTER — Other Ambulatory Visit (INDEPENDENT_AMBULATORY_CARE_PROVIDER_SITE_OTHER): Payer: Self-pay

## 2024-02-05 DIAGNOSIS — E1065 Type 1 diabetes mellitus with hyperglycemia: Secondary | ICD-10-CM

## 2024-02-24 ENCOUNTER — Other Ambulatory Visit (INDEPENDENT_AMBULATORY_CARE_PROVIDER_SITE_OTHER): Payer: Self-pay

## 2024-02-24 DIAGNOSIS — E1065 Type 1 diabetes mellitus with hyperglycemia: Secondary | ICD-10-CM

## 2024-03-04 ENCOUNTER — Telehealth: Payer: Self-pay | Admitting: Physician Assistant

## 2024-03-04 DIAGNOSIS — Z30011 Encounter for initial prescription of contraceptive pills: Secondary | ICD-10-CM | POA: Diagnosis not present

## 2024-03-04 DIAGNOSIS — E1065 Type 1 diabetes mellitus with hyperglycemia: Secondary | ICD-10-CM

## 2024-03-04 MED ORDER — NORETHIN ACE-ETH ESTRAD-FE 1-20 MG-MCG PO TABS: 1.0000 | ORAL_TABLET | Freq: Every day | ORAL | 0 refills | Status: AC

## 2024-03-04 MED ORDER — DEXCOM G6 TRANSMITTER MISC: 0 refills | Status: AC

## 2024-03-04 MED ORDER — DEXCOM G6 SENSOR MISC: 0 refills | Status: AC

## 2024-03-04 NOTE — Patient Instructions (Signed)
 Kimberly [name], thank you for joining Delon CHRISTELLA Dickinson, PA-C for today's virtual visit.  While this provider is not your primary care provider (PCP), if your PCP is located in our provider database this encounter information will be shared with them immediately following your visit.   A [facility] MyChart account gives you access to today's visit and all your visits, tests, and labs performed at [facility]  click here if you don't have a [facility] MyChart account or go to [email or web address]  Consent: (Patient) Kimberly [name] provided verbal consent for this virtual visit at the beginning of the encounter.  Current Medications:  Current Outpatient Medications:    norethindrone-ethinyl estradiol-FE (LARIN FE 1/20) 1-20 MG-MCG tablet, Take 1 tablet by mouth daily. Skip placebo weeks and take continuously, Disp: 84 tablet, Rfl: 0   ACCU-CHEK FASTCLIX LANCETS MISC, Check sugar 10 x daily (Patient not taking: Reported on 08/14/2023), Disp: 306 each, Rfl: 3   acetone, urine, test strip, Check ketones per protocol, Disp: 50 each, Rfl: 3   Alcohol  Swabs (ALCOHOL  PADS) 70 % PADS, Use to wipe skin before injections, Disp: 200 each, Rfl: 6   azelastine  (ASTELIN ) 0.1 % nasal spray, 2 sprays each nostril 1-2 times daily as needed. (Patient not taking: Reported on 08/14/2023), Disp: 30 mL, Rfl: 1   BD PEN NEEDLE NANO U/F 32G X 4 MM MISC, USE 1 PEN NEEDLE AS DIRECTED (Patient not taking: Reported on 08/14/2023), Disp: 200 each, Rfl: 3   Blood Glucose Monitoring Suppl (CONTOUR NEXT EZ) w/Device KIT, 1 kit by Does not apply route daily., Disp: 2 kit, Rfl: 6   Blood Glucose Monitoring Suppl (CONTOUR NEXT MONITOR) w/Device KIT, Use as directed to check Blood glucose 6x/day, Disp: 1 kit, Rfl: 1   cetirizine  (ZYRTEC ) 10 MG tablet, Take 1 tablet (10 mg total) by mouth daily., Disp: 30 tablet, Rfl: 2   Continuous Glucose Sensor (DEXCOM G6 SENSOR) MISC, USE AS DIRECTED TO CHECK  GLUCOSE. WEAR CONTINUSOULY FOR 10 DAYS THEN REPLACE WITH A NEW SENSOR., Disp: 9 each, Rfl: 0   Continuous Glucose Transmitter (DEXCOM G6 TRANSMITTER) MISC, USE WITH G6 SENSORS, CHANGE  TRANSMITTER EVERY 90 DAYS, Disp: 1 each, Rfl: 0   Crisaborole  (EUCRISA ) 2 % OINT, Apply 1 application topically 2 (two) times daily as needed., Disp: 60 g, Rfl: 1   fluticasone  (FLONASE ) 50 MCG/ACT nasal spray, Use per MD order for site irritation. (Patient not taking: Reported on 08/14/2023), Disp: 16 g, Rfl: 5   Glucagon  (BAQSIMI  TWO PACK) 3 MG/DOSE POWD, Place 1 application  into the nose as needed. Use as directed if unconscious, unable to take food po, or having a seizure due to hypoglycemia, Disp: 2 each, Rfl: 1   glucose blood (CONTOUR NEXT TEST) test strip, Check glucose 6x daily, Disp: 200 each, Rfl: 0   glucose blood (CONTOUR NEXT TEST) test strip, Use as instructed 6x/day, Disp: 200 each, Rfl: 5   Insulin  Disposable Pump (OMNIPOD 5 G6 PODS, GEN 5,) MISC, INJECT 1 POD SUBCUTANEOUSLY AS  DIRECTED AND CHANGE POD EVERY 48 HOURS (Patient not taking: Reported on 08/14/2023), Disp: 15 each, Rfl: 5   Insulin  Glargine (LANTUS  SOLOSTAR) 100 UNIT/ML Solostar Pen, Up to 50 units per day as directed by MD (Patient not taking: Reported on 08/14/2023), Disp: 15 mL, Rfl: 6   insulin  lispro (HUMALOG  KWIKPEN) 100 UNIT/ML KwikPen, Up to 50 units/day, Disp: 15 mL, Rfl: 5   insulin  lispro (HUMALOG  KWIKPEN) 200 UNIT/ML KwikPen, Inject up  to 200 units per day into insulin  pump as instructed, Disp: 30 mL, Rfl: 5   insulin  lispro (HUMALOG ) 100 UNIT/ML injection, INJECT UP TO 200 UNITS INTO THE SKIN EVERY 48 HOURS, Disp: 120 mL, Rfl: 0   Insulin  Pen Needle (INSUPEN PEN NEEDLES) 32G X 4 MM MISC, USE WITH INSULIN  PEN 6 TIMES DAILY, Disp: 200 each, Rfl: 5   metFORMIN  (GLUCOPHAGE ) 500 MG tablet, Take 2 tablets (1,000 mg total) by mouth 2 (two) times daily with a meal., Disp: 360 tablet, Rfl: 3   metFORMIN  (GLUCOPHAGE -XR) 750 MG 24 hr tablet,  Take 2 tablets (1,500 mg total) by mouth daily with supper., Disp: 60 tablet, Rfl: 4   Microlet Lancets MISC, Use as directed 6x/day, Disp: 200 each, Rfl: 5   montelukast  (SINGULAIR ) 10 MG tablet, Take 1 tablet (10 mg total) by mouth at bedtime. (Patient not taking: Reported on 08/14/2023), Disp: 30 tablet, Rfl: 5   mupirocin  ointment (BACTROBAN ) 2 %, Apply to skin on ears three times a day for 5 days (Patient not taking: Reported on 08/14/2023), Disp: 22 g, Rfl: 0   Semaglutide ,0.25 or 0.5MG /DOS, (OZEMPIC , 0.25 OR 0.5 MG/DOSE,) 2 MG/1.5ML SOPN, Inject 0.5 mg into the skin once a week. Use this dose until your next appointment with Dr. Jessup. (Patient not taking: Reported on 08/14/2023), Disp: 1.5 mL, Rfl: 2   triamcinolone  ointment (KENALOG ) 0.1 %, Apply 1 application topically 2 (two) times daily. (Patient not taking: Reported on 08/14/2023), Disp: 30 g, Rfl: 3   Medications ordered in this encounter:  Meds ordered this encounter  Medications   Continuous Glucose Sensor (DEXCOM G6 SENSOR) MISC    Sig: USE AS DIRECTED TO CHECK GLUCOSE. WEAR CONTINUSOULY FOR 10 DAYS THEN REPLACE WITH A NEW SENSOR.    Dispense:  9 each    Refill:  0    Supervising Provider:   BLAISE ALEENE KIDD [[id number]]   Continuous Glucose Transmitter (DEXCOM G6 TRANSMITTER) MISC    Sig: USE WITH G6 SENSORS, CHANGE  TRANSMITTER EVERY 90 DAYS    Dispense:  1 each    Refill:  0    Please send a replace/new response with 90-Day Supply if appropriate to maximize member benefit.    Supervising Provider:   LAMPTEY, PHILIP O [[id number]]   norethindrone-ethinyl estradiol-FE (LARIN FE 1/20) 1-20 MG-MCG tablet    Sig: Take 1 tablet by mouth daily. Skip placebo weeks and take continuously    Dispense:  84 tablet    Refill:  0    Supervising Provider:   BLAISE ALEENE KIDD [phone]     *If you need refills on other medications prior to your next appointment, please contact your pharmacy*  Follow-Up: Call back or seek an in-person  evaluation if the symptoms worsen or if the condition fails to improve as anticipated.  [facility] [phone]   If you have been instructed to have an in-person evaluation today at a local Urgent Care facility, please use the link below. It will take you to a list of all of our available [facility], including address, phone number and hours of operation. Please do not delay care.  [facility]  If you or a family member do not have a primary care provider, use the link below to schedule a visit and establish care. When you choose a  primary care physician or advanced practice provider, you gain a long-term partner in health. Find a Primary Care Provider  Learn  more about [facility]'s in-office and virtual care options:  - Get Care Now

## 2024-03-04 NOTE — Progress Notes (Signed)
 Virtual Visit Consent   Kimberly [name], you are scheduled for a virtual visit with a [facility] provider today. Just as with appointments in the office, your consent must be obtained to participate. Your consent will be active for this visit and any virtual visit you may have with one of our providers in the next 365 days. If you have a MyChart account, a copy of this consent can be sent to you electronically.  As this is a virtual visit, video technology does not allow for your provider to perform a traditional examination. This may limit your provider's ability to fully assess your condition. If your provider identifies any concerns that need to be evaluated in person or the need to arrange testing (such as labs, EKG, etc.), we will make arrangements to do so. Although advances in technology are sophisticated, we cannot ensure that it will always work on either your end or our end. If the connection with a video visit is poor, the visit may have to be switched to a telephone visit. With either a video or telephone visit, we are not always able to ensure that we have a secure connection.  By engaging in this virtual visit, you consent to the provision of healthcare and authorize for your insurance to be billed (if applicable) for the services provided during this visit. Depending on your insurance coverage, you may receive a charge related to this service.  I need to obtain your verbal consent now. Are you willing to proceed with your visit today? Kimberly [name] has provided verbal consent on 03/04/2024 for a virtual visit (video or telephone). Kimberly CHRISTELLA Dickinson, PA-C  Date: 03/04/2024 3:07 PM   Virtual Visit via Video Note   I, Kimberly [name], connected with  Kimberly [name]  ([id number], 06-Nov-2005) on 03/04/24 at  2:15 PM EDT by a video-enabled telemedicine application and verified that I am speaking with the correct person using two identifiers.  Location: Patient:  Virtual Visit Location Patient: Home Provider: Virtual Visit Location Provider: Home Office   I discussed the limitations of evaluation and management by telemedicine and the availability of in person appointments. The patient expressed understanding and agreed to proceed.    History of Present Illness: Kimberly [name] is a 18 y.o. who identifies as a female who was assigned female at birth, and is being seen today for medication refills of OCP- no doses missed, and to have her Dexcom refilled.  Patient relocated from [facility] to [place]  and has not established care locally yet.   Problems:  Patient Active Problem List   Diagnosis Date Noted   T1DM (type 1 diabetes mellitus) (HCC) 09/10/2017   Acanthosis nigricans 09/10/2017   Hyperglycemia due to type 1 diabetes mellitus (HCC)    Adjustment reaction to medical therapy    DKA (diabetic ketoacidosis) (HCC) 03/30/2016   New onset of diabetes mellitus in pediatric patient [facility])    Eczema 01/26/2016   Rhinitis, allergic 01/26/2016   History of multiple allergies 01/26/2016   Obesity due to excess calories without serious comorbidity with body mass index (BMI) in 95th to 98th percentile for age in pediatric patient 01/26/2016   Esophageal reflux 01/26/2016    Allergies: No Known Allergies Medications:  Current Outpatient Medications:    norethindrone-ethinyl estradiol-FE (LARIN FE 1/20) 1-20 MG-MCG tablet, Take 1 tablet by mouth daily. Skip placebo weeks and take continuously, Disp: 84 tablet, Rfl: 0   ACCU-CHEK FASTCLIX LANCETS MISC, Check sugar 10 x daily (Patient  not taking: Reported on 08/14/2023), Disp: 306 each, Rfl: 3   acetone, urine, test strip, Check ketones per protocol, Disp: 50 each, Rfl: 3   Alcohol  Swabs (ALCOHOL  PADS) 70 % PADS, Use to wipe skin before injections, Disp: 200 each, Rfl: 6   azelastine  (ASTELIN ) 0.1 % nasal spray, 2 sprays each nostril 1-2 times daily as needed. (Patient not taking: Reported on  08/14/2023), Disp: 30 mL, Rfl: 1   BD PEN NEEDLE NANO U/F 32G X 4 MM MISC, USE 1 PEN NEEDLE AS DIRECTED (Patient not taking: Reported on 08/14/2023), Disp: 200 each, Rfl: 3   Blood Glucose Monitoring Suppl (CONTOUR NEXT EZ) w/Device KIT, 1 kit by Does not apply route daily., Disp: 2 kit, Rfl: 6   Blood Glucose Monitoring Suppl (CONTOUR NEXT MONITOR) w/Device KIT, Use as directed to check Blood glucose 6x/day, Disp: 1 kit, Rfl: 1   cetirizine  (ZYRTEC ) 10 MG tablet, Take 1 tablet (10 mg total) by mouth daily., Disp: 30 tablet, Rfl: 2   Continuous Glucose Sensor (DEXCOM G6 SENSOR) MISC, USE AS DIRECTED TO CHECK GLUCOSE. WEAR CONTINUSOULY FOR 10 DAYS THEN REPLACE WITH A NEW SENSOR., Disp: 9 each, Rfl: 0   Continuous Glucose Transmitter (DEXCOM G6 TRANSMITTER) MISC, USE WITH G6 SENSORS, CHANGE  TRANSMITTER EVERY 90 DAYS, Disp: 1 each, Rfl: 0   Crisaborole  (EUCRISA ) 2 % OINT, Apply 1 application topically 2 (two) times daily as needed., Disp: 60 g, Rfl: 1   fluticasone  (FLONASE ) 50 MCG/ACT nasal spray, Use per MD order for site irritation. (Patient not taking: Reported on 08/14/2023), Disp: 16 g, Rfl: 5   Glucagon  (BAQSIMI  TWO PACK) 3 MG/DOSE POWD, Place 1 application  into the nose as needed. Use as directed if unconscious, unable to take food po, or having a seizure due to hypoglycemia, Disp: 2 each, Rfl: 1   glucose blood (CONTOUR NEXT TEST) test strip, Check glucose 6x daily, Disp: 200 each, Rfl: 0   glucose blood (CONTOUR NEXT TEST) test strip, Use as instructed 6x/day, Disp: 200 each, Rfl: 5   Insulin  Disposable Pump (OMNIPOD 5 G6 PODS, GEN 5,) MISC, INJECT 1 POD SUBCUTANEOUSLY AS  DIRECTED AND CHANGE POD EVERY 48 HOURS (Patient not taking: Reported on 08/14/2023), Disp: 15 each, Rfl: 5   Insulin  Glargine (LANTUS  SOLOSTAR) 100 UNIT/ML Solostar Pen, Up to 50 units per day as directed by MD (Patient not taking: Reported on 08/14/2023), Disp: 15 mL, Rfl: 6   insulin  lispro (HUMALOG  KWIKPEN) 100 UNIT/ML  KwikPen, Up to 50 units/day, Disp: 15 mL, Rfl: 5   insulin  lispro (HUMALOG  KWIKPEN) 200 UNIT/ML KwikPen, Inject up to 200 units per day into insulin  pump as instructed, Disp: 30 mL, Rfl: 5   insulin  lispro (HUMALOG ) 100 UNIT/ML injection, INJECT UP TO 200 UNITS INTO THE SKIN EVERY 48 HOURS, Disp: 120 mL, Rfl: 0   Insulin  Pen Needle (INSUPEN PEN NEEDLES) 32G X 4 MM MISC, USE WITH INSULIN  PEN 6 TIMES DAILY, Disp: 200 each, Rfl: 5   metFORMIN  (GLUCOPHAGE ) 500 MG tablet, Take 2 tablets (1,000 mg total) by mouth 2 (two) times daily with a meal., Disp: 360 tablet, Rfl: 3   metFORMIN  (GLUCOPHAGE -XR) 750 MG 24 hr tablet, Take 2 tablets (1,500 mg total) by mouth daily with supper., Disp: 60 tablet, Rfl: 4   Microlet Lancets MISC, Use as directed 6x/day, Disp: 200 each, Rfl: 5   montelukast  (SINGULAIR ) 10 MG tablet, Take 1 tablet (10 mg total) by mouth at bedtime. (Patient not taking: Reported on  08/14/2023), Disp: 30 tablet, Rfl: 5   mupirocin  ointment (BACTROBAN ) 2 %, Apply to skin on ears three times a day for 5 days (Patient not taking: Reported on 08/14/2023), Disp: 22 g, Rfl: 0   Semaglutide ,0.25 or 0.5MG /DOS, (OZEMPIC , 0.25 OR 0.5 MG/DOSE,) 2 MG/1.5ML SOPN, Inject 0.5 mg into the skin once a week. Use this dose until your next appointment with Dr. Jessup. (Patient not taking: Reported on 08/14/2023), Disp: 1.5 mL, Rfl: 2   triamcinolone  ointment (KENALOG ) 0.1 %, Apply 1 application topically 2 (two) times daily. (Patient not taking: Reported on 08/14/2023), Disp: 30 g, Rfl: 3  Observations/Objective: Patient is well-developed, well-nourished in no acute distress.  Resting comfortably at home.  Head is normocephalic, atraumatic.  No labored breathing.  Speech is clear and coherent with logical content.  Patient is alert and oriented at baseline.    Assessment and Plan: 1. Type 1 diabetes mellitus with hyperglycemia (HCC) - Continuous Glucose Sensor (DEXCOM G6 SENSOR) MISC; USE AS DIRECTED TO CHECK  GLUCOSE. WEAR CONTINUSOULY FOR 10 DAYS THEN REPLACE WITH A NEW SENSOR.  Dispense: 9 each; Refill: 0 - Continuous Glucose Transmitter (DEXCOM G6 TRANSMITTER) MISC; USE WITH G6 SENSORS, CHANGE  TRANSMITTER EVERY 90 DAYS  Dispense: 1 each; Refill: 0  2. Encounter for prescription of oral contraceptives (Primary) - norethindrone-ethinyl estradiol-FE (LARIN FE 1/20) 1-20 MG-MCG tablet; Take 1 tablet by mouth daily. Skip placebo weeks and take continuously  Dispense: 84 tablet; Refill: 0  - Refilled Dexcom G6 sensor and transmitter x 90 days - Refilled OCP x 90 days and updated Rx to say take continuous - Advised to establish care with a local PCP initially and they can refer her to an Endocrinologist as needed as they can refill her medications until she establishes care - Unfortunately, I am unable to assist with getting her established since she is out of the [facility] area at this time  Follow Up Instructions: I discussed the assessment and treatment plan with the patient. The patient was provided an opportunity to ask questions and all were answered. The patient agreed with the plan and demonstrated an understanding of the instructions.  A copy of instructions were sent to the patient via MyChart unless otherwise noted below.    The patient was advised to call back or seek an in-person evaluation if the symptoms worsen or if the condition fails to improve as anticipated.    Kimberly CHRISTELLA Dickinson, PA-C

## 2024-03-06 ENCOUNTER — Encounter: Payer: Self-pay | Admitting: *Deleted

## 2024-03-16 ENCOUNTER — Other Ambulatory Visit (INDEPENDENT_AMBULATORY_CARE_PROVIDER_SITE_OTHER): Payer: Self-pay | Admitting: Pediatrics

## 2024-03-16 DIAGNOSIS — E88819 Insulin resistance, unspecified: Secondary | ICD-10-CM

## 2024-03-16 DIAGNOSIS — E1065 Type 1 diabetes mellitus with hyperglycemia: Secondary | ICD-10-CM
